# Patient Record
Sex: Female | Born: 1965 | Race: White | Hispanic: No | Marital: Married | Smoking: Never smoker
Health system: Southern US, Community
[De-identification: ages and names within clinical notes are randomized; demographics above are authoritative.]

## PROBLEM LIST (undated history)

## (undated) DIAGNOSIS — Z780 Asymptomatic menopausal state: Secondary | ICD-10-CM

## (undated) DIAGNOSIS — R232 Flushing: Secondary | ICD-10-CM

## (undated) DIAGNOSIS — F439 Reaction to severe stress, unspecified: Secondary | ICD-10-CM

## (undated) DIAGNOSIS — R110 Nausea: Secondary | ICD-10-CM

## (undated) DIAGNOSIS — Z79890 Hormone replacement therapy (postmenopausal): Principal | ICD-10-CM

## (undated) DIAGNOSIS — R4589 Other symptoms and signs involving emotional state: Secondary | ICD-10-CM

## (undated) HISTORY — DX: Hormone replacement therapy: Z79.890

## (undated) HISTORY — DX: Nausea: R11.0

## (undated) HISTORY — DX: Flushing: R23.2

## (undated) HISTORY — DX: Other symptoms and signs involving emotional state: R45.89

## (undated) HISTORY — DX: Reaction to severe stress, unspecified: F43.9

## (undated) HISTORY — DX: Asymptomatic menopausal state: Z78.0

---

## 1991-04-11 HISTORY — PX: FOOT SURGERY: SHX648

## 2013-03-24 ENCOUNTER — Other Ambulatory Visit (HOSPITAL_COMMUNITY): Payer: Self-pay | Admitting: Family Medicine

## 2013-03-24 ENCOUNTER — Ambulatory Visit (HOSPITAL_COMMUNITY)
Admission: RE | Admit: 2013-03-24 | Discharge: 2013-03-24 | Disposition: A | Discharge status: Home / Self Care | Payer: BC Managed Care – PPO | Source: Ambulatory Visit | Attending: Family Medicine | Admitting: Family Medicine

## 2013-03-24 DIAGNOSIS — Z139 Encounter for screening, unspecified: Secondary | ICD-10-CM

## 2013-03-24 DIAGNOSIS — Z1231 Encounter for screening mammogram for malignant neoplasm of breast: Secondary | ICD-10-CM | POA: Insufficient documentation

## 2015-10-05 ENCOUNTER — Ambulatory Visit (INDEPENDENT_AMBULATORY_CARE_PROVIDER_SITE_OTHER): Payer: BLUE CROSS/BLUE SHIELD | Admitting: Adult Health

## 2015-10-05 ENCOUNTER — Encounter: Payer: Self-pay | Admitting: Adult Health

## 2015-10-05 ENCOUNTER — Other Ambulatory Visit (HOSPITAL_COMMUNITY)
Admission: RE | Admit: 2015-10-05 | Discharge: 2015-10-05 | Disposition: A | Discharge status: Home / Self Care | Payer: BLUE CROSS/BLUE SHIELD | Source: Ambulatory Visit | Attending: Adult Health | Admitting: Adult Health

## 2015-10-05 VITALS — BP 130/80 | HR 78 | Ht 62.0 in | Wt 155.0 lb

## 2015-10-05 DIAGNOSIS — Z01419 Encounter for gynecological examination (general) (routine) without abnormal findings: Secondary | ICD-10-CM

## 2015-10-05 DIAGNOSIS — R4589 Other symptoms and signs involving emotional state: Secondary | ICD-10-CM | POA: Insufficient documentation

## 2015-10-05 DIAGNOSIS — F439 Reaction to severe stress, unspecified: Secondary | ICD-10-CM

## 2015-10-05 DIAGNOSIS — Z1151 Encounter for screening for human papillomavirus (HPV): Secondary | ICD-10-CM | POA: Diagnosis present

## 2015-10-05 DIAGNOSIS — Z1212 Encounter for screening for malignant neoplasm of rectum: Secondary | ICD-10-CM

## 2015-10-05 DIAGNOSIS — R110 Nausea: Secondary | ICD-10-CM

## 2015-10-05 DIAGNOSIS — R232 Flushing: Secondary | ICD-10-CM

## 2015-10-05 DIAGNOSIS — Z780 Asymptomatic menopausal state: Secondary | ICD-10-CM

## 2015-10-05 HISTORY — DX: Other symptoms and signs involving emotional state: R45.89

## 2015-10-05 HISTORY — DX: Reaction to severe stress, unspecified: F43.9

## 2015-10-05 HISTORY — DX: Flushing: R23.2

## 2015-10-05 HISTORY — DX: Nausea: R11.0

## 2015-10-05 HISTORY — DX: Asymptomatic menopausal state: Z78.0

## 2015-10-05 LAB — HEMOCCULT GUIAC POC 1CARD (OFFICE): Fecal Occult Blood, POC: NEGATIVE

## 2015-10-05 MED ORDER — CONJ ESTROGENS-BAZEDOXIFENE 0.45-20 MG PO TABS: ORAL_TABLET | ORAL | Status: DC

## 2015-10-05 MED ORDER — OMEPRAZOLE 20 MG PO CPDR: 20.0000 mg | DELAYED_RELEASE_CAPSULE | Freq: Every day | ORAL | Status: DC

## 2015-10-05 NOTE — Progress Notes (Signed)
Patient ID: Tracy CurtisMary Leon, female   DOB: [birth year], 50 y.o.   MRN: [id number] History of Present Illness: Tracy DandyMary is a [age] year old white female, married in for a well woman gyn exam and pap.She says it has been at least 5 years since last pap.She is PM and is having hot flashes and moodiness and is teary at times.She has been having some nausea for about 2 months now, no vomiting, and has stress at home, husband has lupus and also stress at work.   Current Medications, Allergies, Past Medical History, Past Surgical History, Family History and Social History were reviewed in [facility] electronic medical record.     Review of Systems: Patient denies any headaches, hearing loss, fatigue, blurred vision, shortness of breath, chest pain, abdominal pain, problems with bowel movements, urination, or intercourse. No joint pain, see HPI for positives.    Physical Exam:BP 130/80 mmHg  Pulse 78  Ht 5\' 2"  (1.575 m)  Wt 155 lb (70.308 kg)  BMI 28.34 kg/m2 General:  Well developed, well nourished, no acute distress Skin:  Warm and dry Neck:  Midline trachea, normal thyroid, good ROM, no lymphadenopathy Lungs; Clear to auscultation bilaterally Breast:  No dominant palpable mass, retraction, or nipple discharge Cardiovascular: Regular rate and rhythm Abdomen:  Soft, non tender, no hepatosplenomegaly Pelvic:  External genitalia is normal in appearance, no lesions.  The vagina is normal in appearance. Urethra has no lesions or masses. The cervix is smooth, pap with HPV performed.  Uterus is felt to be normal size, shape, and contour.  No adnexal masses or tenderness noted.Bladder is non tender, no masses felt. Rectal: Good sphincter tone, no polyps, or hemorrhoids felt.  Hemoccult negative. Extremities/musculoskeletal:  No swelling or varicosities noted, no clubbing or cyanosis Psych:  No mood changes, alert and cooperative,seems happy Discussed HRT and SSRIs will try HRT, aware of risk and  benefits.  Impression: Well woman gyn exam and pap Nausea PM Hot flashes Moodiness Stress     Plan: Given samples,#28 tabs of Duavee, lot [id number] exp 4/18,take 1 daily  Rx prilosec 20 mg #30 take 1 daily with 6 refills Follow up in 3 weeks for ROS and labs  Physical in 1 year, pap in 3 if normal Mammogram advised now  Colonoscopy advised  Review handout on menopause

## 2015-10-05 NOTE — Patient Instructions (Addendum)
Follow up in 3 weeks for ROS and labs Physical in 1 year, pap in 3 if normal Get mammogram Colonoscopy advised  Menopause Menopause is the normal time of life when menstrual periods stop completely. Menopause is complete when you have missed 12 consecutive menstrual periods. It usually occurs between the ages of 48 years and 55 years. Very rarely does a woman develop menopause before the age of 40 years. At menopause, your ovaries stop producing the female hormones estrogen and progesterone. This can cause undesirable symptoms and also affect your health. Sometimes the symptoms may occur 4-5 years before the menopause begins. There is no relationship between menopause and:  Oral contraceptives.  Number of children you had.  Race.  The age your menstrual periods started (menarche). Heavy smokers and very thin women may develop menopause earlier in life. CAUSES  The ovaries stop producing the female hormones estrogen and progesterone.  Other causes include:  Surgery to remove both ovaries.  The ovaries stop functioning for no known reason.  Tumors of the pituitary gland in the brain.  Medical disease that affects the ovaries and hormone production.  Radiation treatment to the abdomen or pelvis.  Chemotherapy that affects the ovaries. SYMPTOMS   Hot flashes.  Night sweats.  Decrease in sex drive.  Vaginal dryness and thinning of the vagina causing painful intercourse.  Dryness of the skin and developing wrinkles.  Headaches.  Tiredness.  Irritability.  Memory problems.  Weight gain.  Bladder infections.  Hair growth of the face and chest.  Infertility. More serious symptoms include:  Loss of bone (osteoporosis) causing breaks (fractures).  Depression.  Hardening and narrowing of the arteries (atherosclerosis) causing heart attacks and strokes. DIAGNOSIS   When the menstrual periods have stopped for 12 straight months.  Physical exam.  Hormone  studies of the blood. TREATMENT  There are many treatment choices and nearly as many questions about them. The decisions to treat or not to treat menopausal changes is an individual choice made with your health care provider. Your health care provider can discuss the treatments with you. Together, you can decide which treatment will work best for you. Your treatment choices may include:   Hormone therapy (estrogen and progesterone).  Non-hormonal medicines.  Treating the individual symptoms with medicine (for example antidepressants for depression).  Herbal medicines that may help specific symptoms.  Counseling by a psychiatrist or psychologist.  Group therapy.  Lifestyle changes including:  Eating healthy.  Regular exercise.  Limiting caffeine and alcohol.  Stress management and meditation.  No treatment. HOME CARE INSTRUCTIONS   Take the medicine your health care provider gives you as directed.  Get plenty of sleep and rest.  Exercise regularly.  Eat a diet that contains calcium (good for the bones) and soy products (acts like estrogen hormone).  Avoid alcoholic beverages.  Do not smoke.  If you have hot flashes, dress in layers.  Take supplements, calcium, and vitamin D to strengthen bones.  You can use over-the-counter lubricants or moisturizers for vaginal dryness.  Group therapy is sometimes very helpful.  Acupuncture may be helpful in some cases. SEEK MEDICAL CARE IF:   You are not sure you are in menopause.  You are having menopausal symptoms and need advice and treatment.  You are still having menstrual periods after age 51 years.  You have pain with intercourse.  Menopause is complete (no menstrual period for 12 months) and you develop vaginal bleeding.  You need a referral to a specialist (gynecologist,  psychiatrist, or psychologist) for treatment. SEEK IMMEDIATE MEDICAL CARE IF:   You have severe depression.  You have excessive vaginal  bleeding.  You fell and think you have a broken bone.  You have pain when you urinate.  You develop leg or chest pain.  You have a fast pounding heart beat (palpitations).  You have severe headaches.  You develop vision problems.  You feel a lump in your breast.  You have abdominal pain or severe indigestion.   This information is not intended to replace advice given to you by your health care provider. Make sure you discuss any questions you have with your health care provider.   Document Released: 06/17/2003 Document Revised: 11/27/2012 Document Reviewed: 10/24/2012 Elsevier Interactive Patient Education [employer]2016 [employer]

## 2015-10-08 LAB — CYTOLOGY - PAP

## 2015-10-19 ENCOUNTER — Other Ambulatory Visit: Payer: Self-pay | Admitting: Adult Health

## 2015-10-19 DIAGNOSIS — Z1231 Encounter for screening mammogram for malignant neoplasm of breast: Secondary | ICD-10-CM

## 2015-10-25 ENCOUNTER — Ambulatory Visit (HOSPITAL_COMMUNITY)
Admission: RE | Admit: 2015-10-25 | Discharge: 2015-10-25 | Disposition: A | Discharge status: Home / Self Care | Payer: BLUE CROSS/BLUE SHIELD | Source: Ambulatory Visit | Attending: Adult Health | Admitting: Adult Health

## 2015-10-25 DIAGNOSIS — Z1231 Encounter for screening mammogram for malignant neoplasm of breast: Secondary | ICD-10-CM | POA: Diagnosis present

## 2015-11-04 ENCOUNTER — Encounter: Payer: Self-pay | Admitting: Adult Health

## 2015-11-04 ENCOUNTER — Ambulatory Visit (INDEPENDENT_AMBULATORY_CARE_PROVIDER_SITE_OTHER): Payer: BLUE CROSS/BLUE SHIELD | Admitting: Adult Health

## 2015-11-04 VITALS — BP 118/70 | HR 82 | Ht 62.0 in | Wt 154.5 lb

## 2015-11-04 DIAGNOSIS — Z13220 Encounter for screening for lipoid disorders: Secondary | ICD-10-CM

## 2015-11-04 DIAGNOSIS — Z79890 Hormone replacement therapy (postmenopausal): Secondary | ICD-10-CM

## 2015-11-04 DIAGNOSIS — Z139 Encounter for screening, unspecified: Secondary | ICD-10-CM

## 2015-11-04 HISTORY — DX: Hormone replacement therapy: Z79.890

## 2015-11-04 MED ORDER — CONJ ESTROGENS-BAZEDOXIFENE 0.45-20 MG PO TABS: ORAL_TABLET | ORAL | 12 refills | Status: DC

## 2015-11-04 NOTE — Patient Instructions (Signed)
Continue Duavee Follow up prn

## 2015-11-04 NOTE — Progress Notes (Signed)
Subjective:     Patient ID: Tracy Leon, female   DOB: 1965/04/19, 50 y.o.   MRN: [id number]  HPI Tracy Leon is a 50 year old white female in for fasting labs and wants results of mammogram done 7/17 and says since starting [facility], has had fewer hot flashes and moods are much better.  Review of Systems  Fewer hot flashes and better moods  Reviewed past medical,surgical, social and family history. Reviewed medications and allergies.     Objective:   Physical Exam BP 118/70 (BP Location: Left Arm, Patient Position: Sitting, Cuff Size: Normal)   Pulse 82   Ht 5\' 2"  (1.575 m)   Wt 154 lb 8 oz (70.1 kg)   BMI 28.26 kg/m  Skin warm and dry. Lungs: clear to ausculation bilaterally. Cardiovascular: regular rate and rhythm.Mammogram was negative, but has dense breast tissue and pap was normal with negative HPV.She wants to continue [facility].    Assessment:     HRT Screening for cholesterol Screening labs    Plan:     Rx Duavee, take 1 daily #28 with 12 refills and discount card given Check CBC,CMP,TSH and lipids,A1c and vitamin D,will talk when labs back  Follow up prn

## 2015-11-05 LAB — COMPREHENSIVE METABOLIC PANEL
A/G RATIO: 1.8 (ref 1.2–2.2)
ALT: 10 IU/L (ref 0–32)
AST: 16 IU/L (ref 0–40)
Albumin: 4.4 g/dL (ref 3.5–5.5)
Alkaline Phosphatase: 74 IU/L (ref 39–117)
BUN/Creatinine Ratio: 19 (ref 9–23)
BUN: 15 mg/dL (ref 6–24)
Bilirubin Total: 0.5 mg/dL (ref 0.0–1.2)
CALCIUM: 9.2 mg/dL (ref 8.7–10.2)
CO2: 26 mmol/L (ref 18–29)
Chloride: 101 mmol/L (ref 96–106)
Creatinine, Ser: 0.81 mg/dL (ref 0.57–1.00)
GFR calc Af Amer: 97 mL/min/{1.73_m2} (ref >59)
GFR, EST NON AFRICAN AMERICAN: 84 mL/min/{1.73_m2} (ref >59)
GLUCOSE: 88 mg/dL (ref 65–99)
Globulin, Total: 2.4 g/dL (ref 1.5–4.5)
POTASSIUM: 3.8 mmol/L (ref 3.5–5.2)
Sodium: 143 mmol/L (ref 134–144)
Total Protein: 6.8 g/dL (ref 6.0–8.5)

## 2015-11-05 LAB — LIPID PANEL
CHOLESTEROL TOTAL: 238 mg/dL — AB (ref 100–199)
Chol/HDL Ratio: 3.5 ratio units (ref 0.0–4.4)
HDL: 68 mg/dL (ref >39)
LDL CALC: 154 mg/dL — AB (ref 0–99)
TRIGLYCERIDES: 78 mg/dL (ref 0–149)
VLDL Cholesterol Cal: 16 mg/dL (ref 5–40)

## 2015-11-05 LAB — TSH: TSH: 2.21 u[IU]/mL (ref 0.450–4.500)

## 2015-11-05 LAB — HEMOGLOBIN A1C
Est. average glucose Bld gHb Est-mCnc: 105 mg/dL
Hgb A1c MFr Bld: 5.3 % (ref 4.8–5.6)

## 2015-11-05 LAB — CBC
Hematocrit: 42.3 % (ref 34.0–46.6)
Hemoglobin: 14 g/dL (ref 11.1–15.9)
MCH: 30.4 pg (ref 26.6–33.0)
MCHC: 33.1 g/dL (ref 31.5–35.7)
MCV: 92 fL (ref 79–97)
PLATELETS: 287 10*3/uL (ref 150–379)
RBC: 4.61 x10E6/uL (ref 3.77–5.28)
RDW: 14 % (ref 12.3–15.4)
WBC: 11.4 10*3/uL — AB (ref 3.4–10.8)

## 2015-11-05 LAB — VITAMIN D 25 HYDROXY (VIT D DEFICIENCY, FRACTURES): VIT D 25 HYDROXY: 29.1 ng/mL — AB (ref 30.0–100.0)

## 2015-11-09 ENCOUNTER — Telehealth: Payer: Self-pay | Admitting: Adult Health

## 2015-11-09 MED ORDER — CHOLECALCIFEROL 50 MCG (2000 UT) PO CAPS: ORAL_CAPSULE | ORAL | Status: DC

## 2015-11-09 NOTE — Telephone Encounter (Signed)
Aware of labs, and need to take vitamin D3 2000 IU per days and watch fats and carbs.

## 2016-01-24 ENCOUNTER — Telehealth: Payer: Self-pay | Admitting: *Deleted

## 2016-01-24 MED ORDER — NORETHINDRONE-ETH ESTRADIOL 1-5 MG-MCG PO TABS: 1.0000 | ORAL_TABLET | Freq: Every day | ORAL | 11 refills | Status: DC

## 2016-01-24 NOTE — Telephone Encounter (Signed)
Will try femhrt

## 2016-01-28 ENCOUNTER — Telehealth: Payer: Self-pay | Admitting: Adult Health

## 2016-01-28 NOTE — Telephone Encounter (Signed)
[facility] with pt. [facility] requested [facility] different due to cost. JAG prescribed Femhrt. Pt thought pharmacy had given the wrong med. Pt states papers from pharmacy said med was for osteoporosis. I looked up Femhrt and advised pt that med is good for osteoporosis but it's also good for hot flashes. Pt voiced understanding. JSY

## 2016-10-29 IMAGING — MG DIGITAL SCREENING BILATERAL MAMMOGRAM WITH CAD
4 series · 4 of 4 positions shown · non-contrast
Comparison: Previous exam(s).

CLINICAL DATA: Screening.

EXAM:
DIGITAL SCREENING BILATERAL MAMMOGRAM WITH CAD

[L MLO]
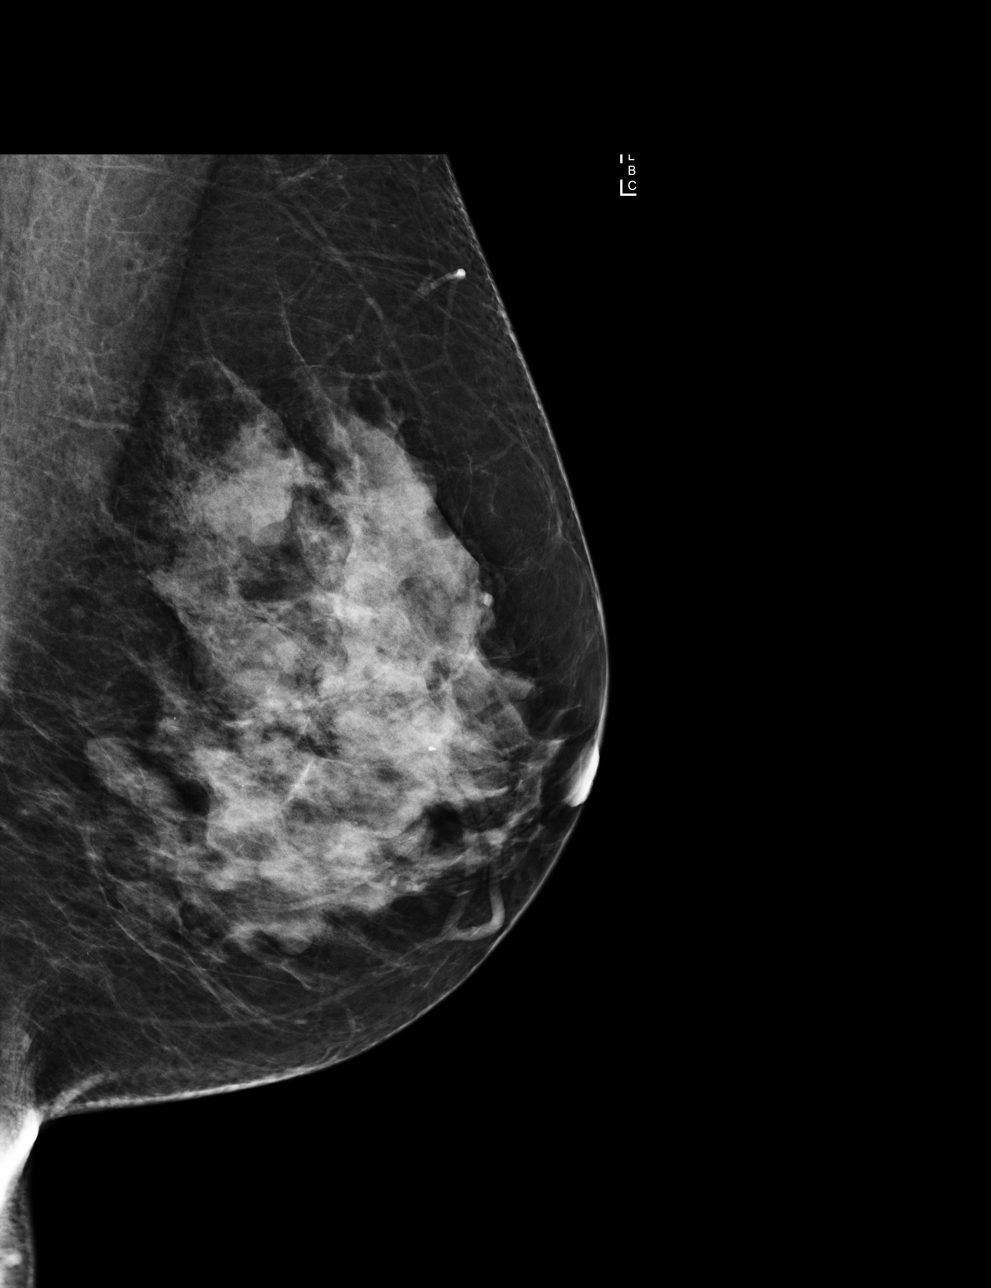

[L CC]
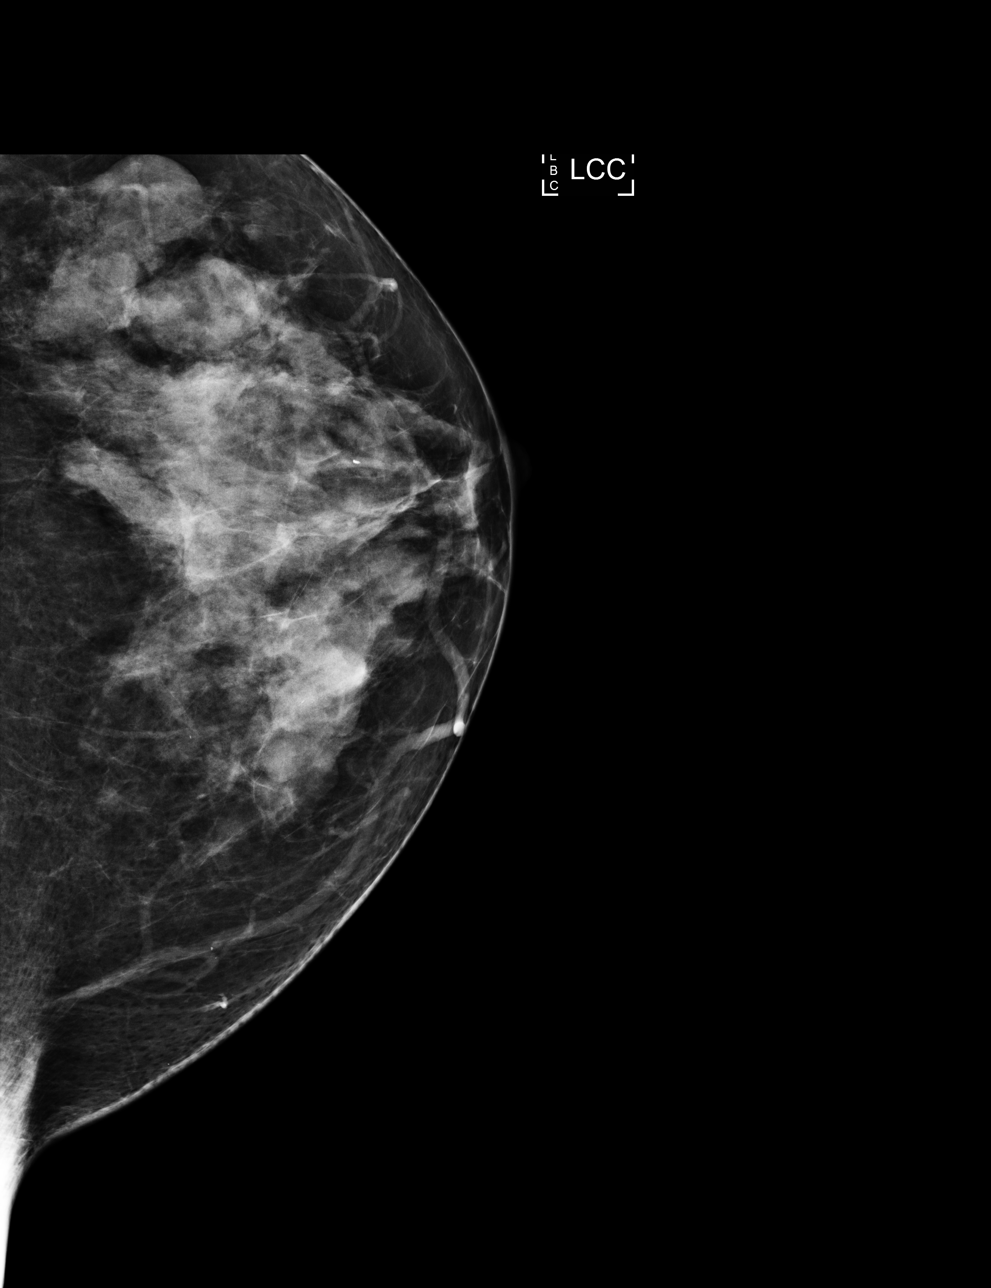

[R MLO]
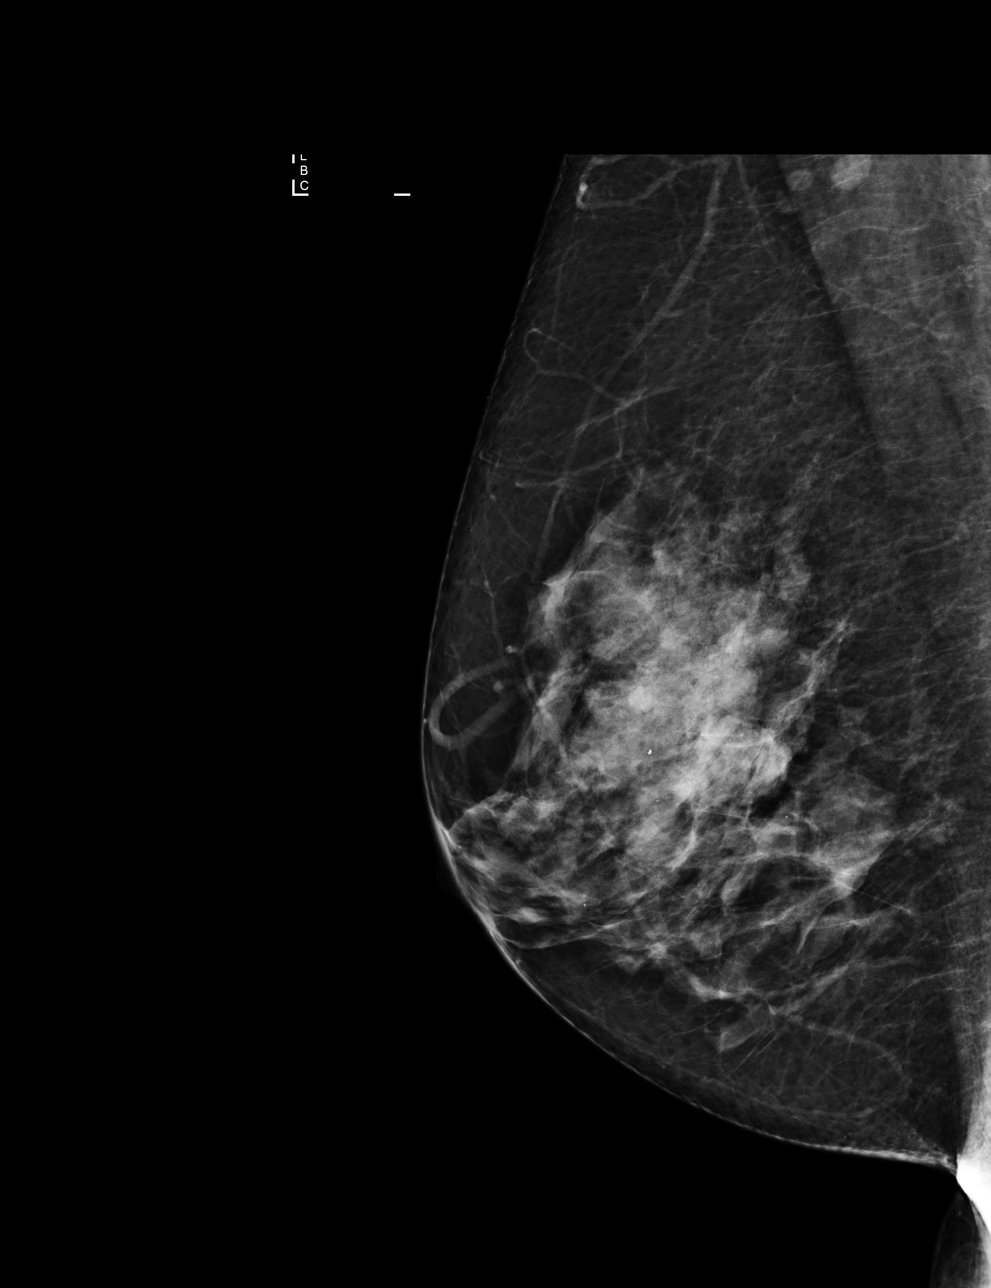

[R CC]
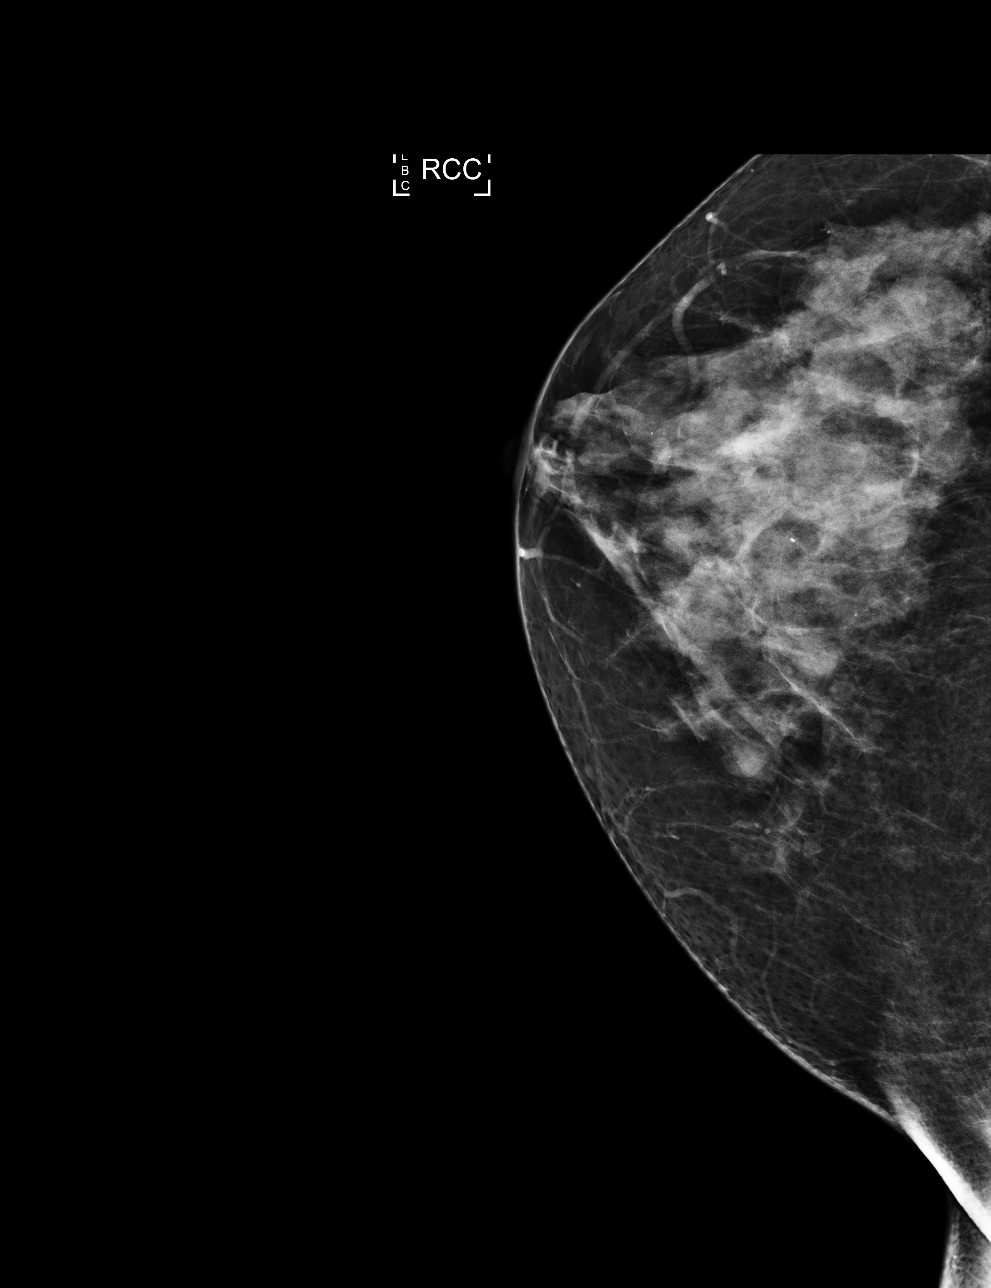

[4 of 4 positions shown; findings below may reference images not displayed]

ACR Breast Density Category d: The breast tissue is extremely dense,
which lowers the sensitivity of mammography.
FINDINGS: There are no findings suspicious for malignancy. Images were
processed with CAD.
IMPRESSION: No mammographic evidence of malignancy. A result letter of this
screening mammogram will be mailed directly to the patient.

RECOMMENDATION:
Screening mammogram in one year. (Code:[id number])

BI-RADS CATEGORY  1: Negative.

## 2017-03-05 ENCOUNTER — Telehealth: Payer: Self-pay | Admitting: Adult Health

## 2017-03-05 NOTE — Telephone Encounter (Signed)
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 Please contact pt

## 2017-03-06 NOTE — Telephone Encounter (Signed)
Has breast lump to come in 11/29 at 4 pm to see me

## 2017-03-06 NOTE — Telephone Encounter (Signed)
Patient states she has found a lump on her breast and would like for a referral to be sent for a mammogram. 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 Please advise.

## 2017-03-08 ENCOUNTER — Ambulatory Visit: Payer: BLUE CROSS/BLUE SHIELD | Admitting: Adult Health

## 2017-03-08 ENCOUNTER — Encounter: Payer: Self-pay | Admitting: Adult Health

## 2017-03-08 VITALS — BP 112/70 | HR 84 | Ht 61.0 in | Wt 159.0 lb

## 2017-03-08 DIAGNOSIS — N6323 Unspecified lump in the left breast, lower outer quadrant: Secondary | ICD-10-CM

## 2017-03-08 NOTE — Progress Notes (Signed)
Subjective:     Patient ID: Tracy CurtisMary Leon, female   DOB: [birth year], 51 y.o.   MRN: [id number]  HPI Tracy Leon is a 51 year old white female in complaining of left breast mass for 5 days and tender for about a week.She is truck driver now.  Review of Systems Left breast mass for 5 days, tender for about a week Reviewed past medical,surgical, social and family history. Reviewed medications and allergies.     Objective:   Physical Exam BP 112/70 (BP Location: Left Arm, Patient Position: Sitting, Cuff Size: Normal)   Pulse 84   Ht 5\' 1"  (1.549 m)   Wt 159 lb (72.1 kg)   BMI 30.04 kg/m     Skin warm and dry,  Breasts:no dominate palpable mass, retraction or nipple discharge on right, on left, no retraction or nipple discharge but has 2 cm mobile tender mass at 3 o'clock, about 2 FB from nipple.   Assessment:     1. Mass of lower outer quadrant of left breast       Plan:     Diagnostic bilateral mammogram and left and right US if needed [facility]

## 2017-03-20 ENCOUNTER — Encounter (HOSPITAL_COMMUNITY): Payer: BLUE CROSS/BLUE SHIELD

## 2017-04-05 ENCOUNTER — Encounter (HOSPITAL_COMMUNITY): Payer: BLUE CROSS/BLUE SHIELD

## 2017-04-11 ENCOUNTER — Ambulatory Visit (HOSPITAL_COMMUNITY)
Admission: RE | Admit: 2017-04-11 | Discharge: 2017-04-11 | Disposition: A | Discharge status: Home / Self Care | Payer: BLUE CROSS/BLUE SHIELD | Source: Ambulatory Visit | Attending: Adult Health | Admitting: Adult Health

## 2017-04-11 ENCOUNTER — Encounter (HOSPITAL_COMMUNITY): Payer: Self-pay

## 2017-04-11 DIAGNOSIS — N6323 Unspecified lump in the left breast, lower outer quadrant: Secondary | ICD-10-CM | POA: Diagnosis not present

## 2017-04-18 ENCOUNTER — Ambulatory Visit: Payer: BLUE CROSS/BLUE SHIELD | Admitting: Adult Health

## 2018-04-16 IMAGING — US US BREAST*L* LIMITED INC AXILLA
1 series · 4 of 4 positions shown · non-contrast
Comparison: Previous exam(s).

CLINICAL DATA: 51-year-old female with palpable lump and tenderness
in the outer left breast discovered on self-examination. Also for
annual bilateral mammograms.

EXAM:
2D DIGITAL DIAGNOSTIC BILATERAL MAMMOGRAM WITH CAD AND ADJUNCT TOMO
ULTRASOUND LEFT BREAST

[Series 1: us breast*left* limited inc axilla · 0.07mm/px · 4 of 4 slices shown]
[im 1/4]
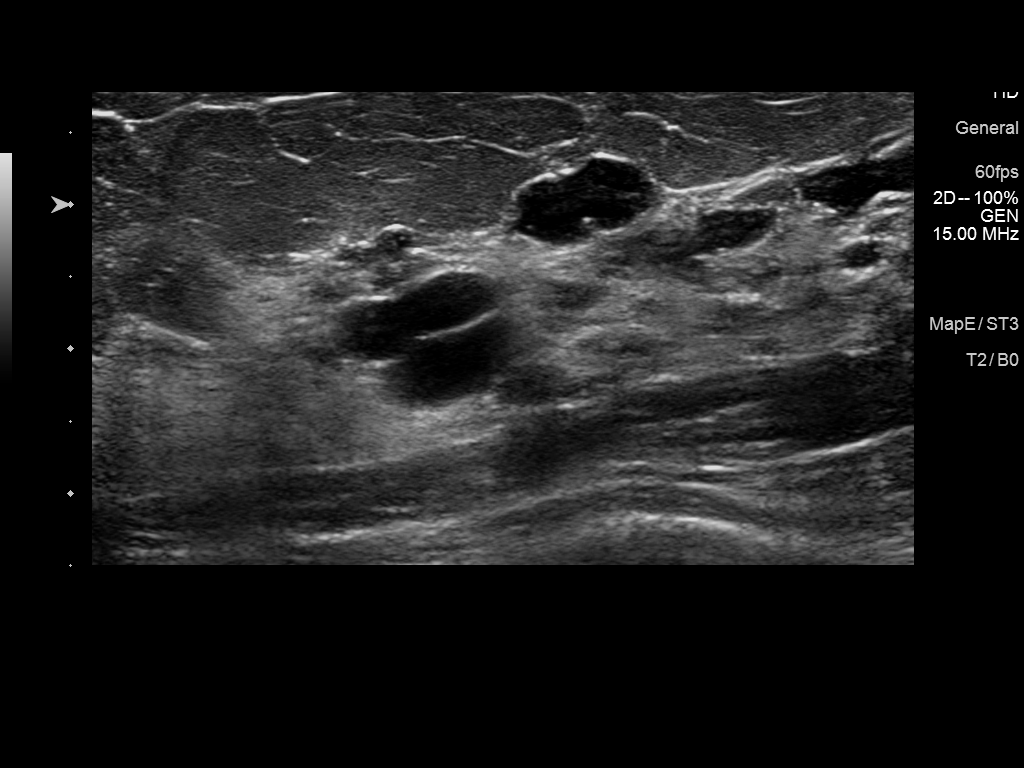
[im 2/4]
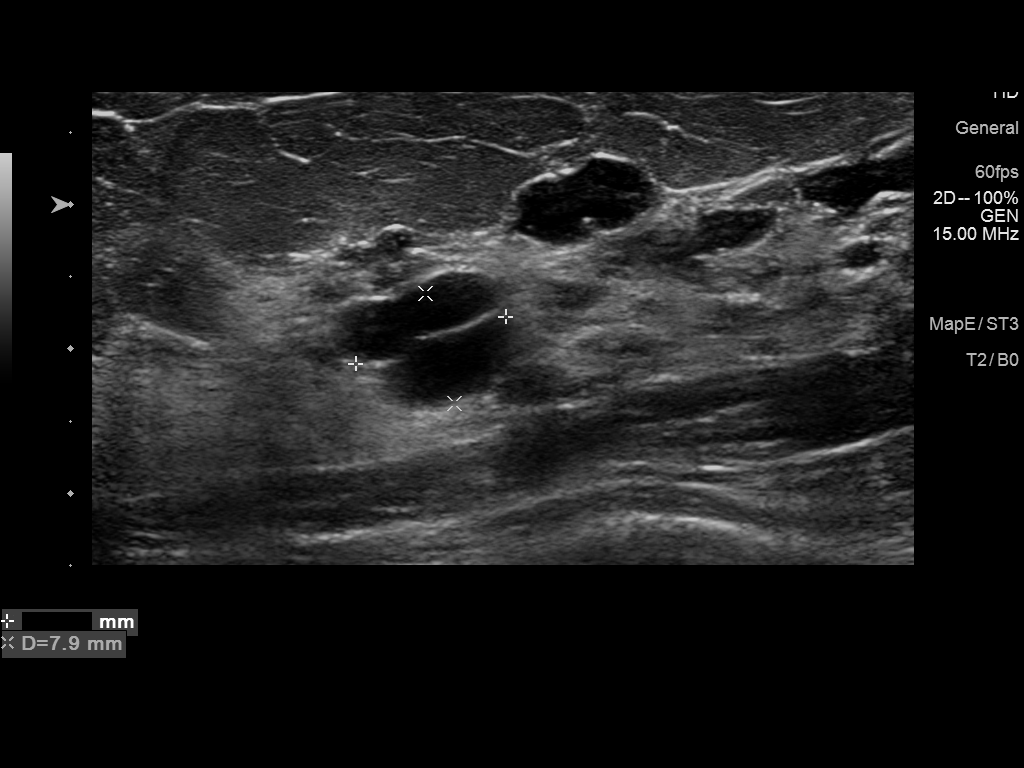
[im 3/4]
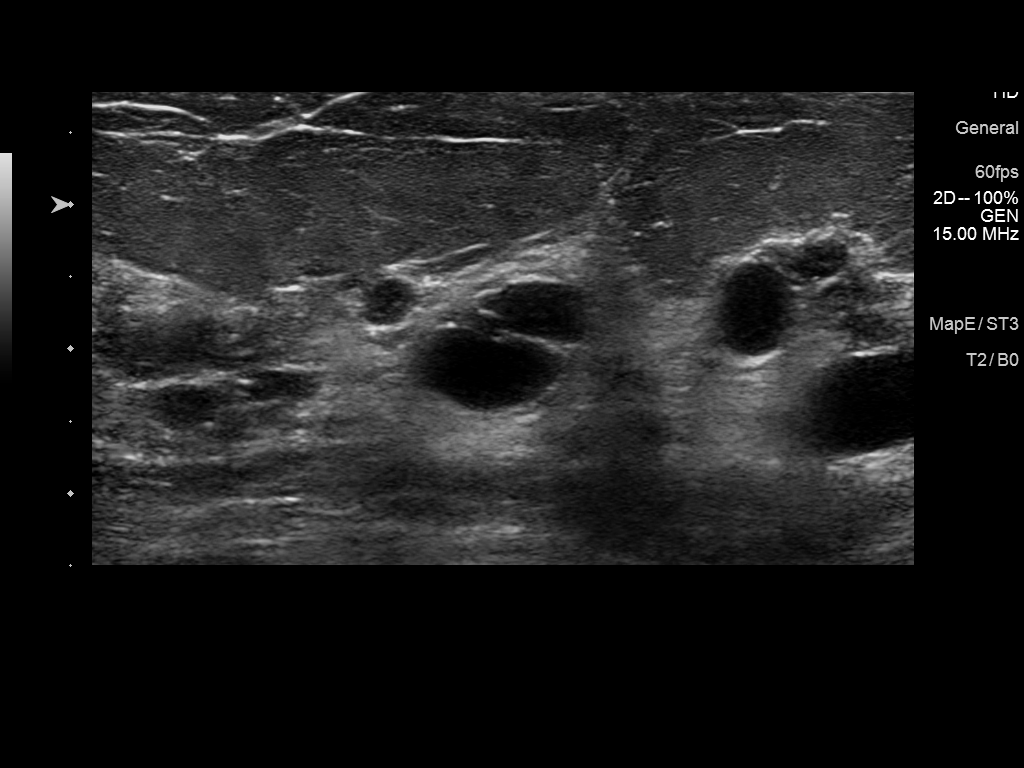
[im 4/4]
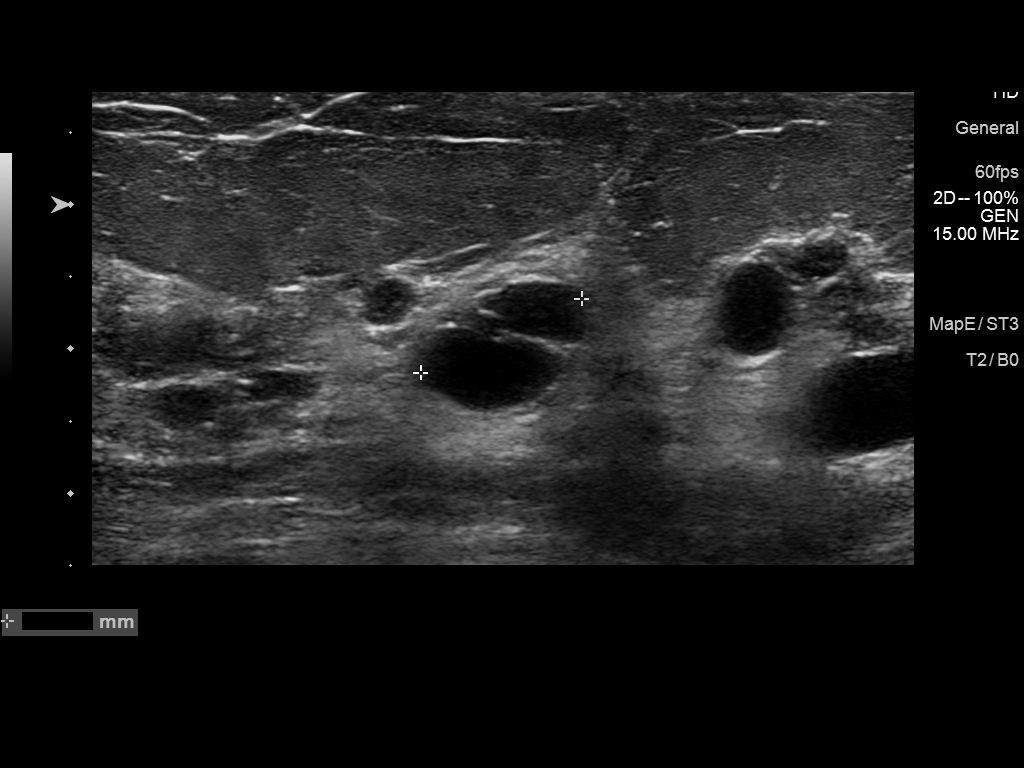

[4 of 4 positions shown; findings below may reference images not displayed]

ACR Breast Density Category c: The breast tissue is heterogeneously
dense, which may obscure small masses.
FINDINGS: 2D and 3D full field views of both breasts and spot compression view
of the left breast demonstrate no suspicious mass, distortion or
worrisome calcifications.

Mammographic images were processed with CAD.

On physical exam, mild nodularity and thickening within the outer
left breast identified.

Targeted ultrasound is performed, showing multiple benign simple and
mildly complicated cysts throughout the upper and outer left breast.
The largest benign cyst measures 1.1 x 0.8 x 1.2 cm at the 2 o'clock
position of the left breast 3 cm from the nipple.

No solid mass, distortion or abnormal shadowing is identified within
the upper or outer left breast.
IMPRESSION: 1. Multiple benign cysts within the upper and outer left breast, 1
representing the patient's palpable abnormality. No suspicious
sonographic abnormality in the area of patient's concern.
2. No mammographic evidence of breast malignancy.

RECOMMENDATION:
Bilateral screening mammograms in 1 year.

I have discussed the findings and recommendations with the patient.
Results were also provided in writing at the conclusion of the
visit. If applicable, a reminder letter will be sent to the patient
regarding the next appointment.

BI-RADS CATEGORY  2: Benign.
